# Patient Record
Sex: Female | Born: 1970 | Race: White | Hispanic: No | Smoking: Former smoker
Health system: Southern US, Community
[De-identification: ages and names within clinical notes are randomized; demographics above are authoritative.]

## PROBLEM LIST (undated history)

## (undated) DIAGNOSIS — M8580 Other specified disorders of bone density and structure, unspecified site: Secondary | ICD-10-CM

## (undated) HISTORY — PX: COLPOSCOPY: SHX161

## (undated) HISTORY — PX: ABDOMINAL HYSTERECTOMY: SHX81

---

## 2013-05-21 ENCOUNTER — Other Ambulatory Visit: Payer: Self-pay | Admitting: Obstetrics and Gynecology

## 2013-05-21 DIAGNOSIS — Z1231 Encounter for screening mammogram for malignant neoplasm of breast: Secondary | ICD-10-CM

## 2013-06-15 ENCOUNTER — Ambulatory Visit (HOSPITAL_COMMUNITY): Payer: Self-pay

## 2013-06-15 ENCOUNTER — Ambulatory Visit (HOSPITAL_COMMUNITY): Payer: Self-pay | Attending: Obstetrics and Gynecology

## 2013-07-13 ENCOUNTER — Ambulatory Visit (HOSPITAL_COMMUNITY)
Admission: RE | Admit: 2013-07-13 | Discharge: 2013-07-13 | Disposition: A | Discharge status: Home / Self Care | Payer: Self-pay | Source: Ambulatory Visit | Attending: Obstetrics and Gynecology | Admitting: Obstetrics and Gynecology

## 2013-07-13 ENCOUNTER — Encounter (HOSPITAL_COMMUNITY): Payer: Self-pay

## 2013-07-13 VITALS — BP 110/76 | Temp 98.4°F | Ht 64.0 in | Wt 150.0 lb

## 2013-07-13 DIAGNOSIS — Z1231 Encounter for screening mammogram for malignant neoplasm of breast: Secondary | ICD-10-CM

## 2013-07-13 DIAGNOSIS — Z1239 Encounter for other screening for malignant neoplasm of breast: Secondary | ICD-10-CM

## 2013-07-13 HISTORY — DX: Other specified disorders of bone density and structure, unspecified site: M85.80

## 2013-07-13 NOTE — Patient Instructions (Signed)
Taught Lynn Malone how to perform BSE. Patient did not need a Pap smear today due to a history of a hysterectomy for benign reasons. Let her know that she does not need any further Pap smears due to her history of a hysterectomy for benign reasons. Let patient know will follow up with her within the next couple weeks with results by letter or phone. Lynn Malone verbalized understanding. Patient escorted to mammography for a screening mammogram.  Mars Scheaffer, Kathaleen Maser, RN 3:07 PM

## 2013-07-13 NOTE — Progress Notes (Signed)
No complaints today.  Pap Smear:    Pap smear not completed today. Last Pap smear was in 2013 at [facility] and normal per patient. Per patient has a history of 2-3 abnormal Pap smears 15+ years ago that required colposcopies for follow up. Patient has a history of a complete hysterectomy 14 years ago due to ovarian cysts and ? dysplasia. No Pap smear results in EPIC.  Physical exam: Breasts Breasts symmetrical. No skin abnormalities bilateral breasts. No nipple retraction bilateral breasts. No nipple discharge bilateral breasts. No lymphadenopathy. No lumps palpated bilateral breasts. No complaints of pain or tenderness on exam. Patient escorted to mammography for a screening mammogram.        Pelvic/Bimanual No Pap smear completed today due to a history of a hysterectomy for benign reasons. Pap smear not indicated per BCCCP guidelines.

## 2013-07-19 ENCOUNTER — Telehealth (HOSPITAL_COMMUNITY): Payer: Self-pay | Admitting: *Deleted

## 2013-07-19 NOTE — Telephone Encounter (Signed)
Patient called wanting to know results of mammogram. Advised was negative and next mammogram due in one year. Patient voiced understanding.

## 2014-05-30 ENCOUNTER — Encounter (HOSPITAL_COMMUNITY): Payer: Self-pay

## 2016-05-06 ENCOUNTER — Other Ambulatory Visit (HOSPITAL_COMMUNITY): Payer: Self-pay | Admitting: *Deleted

## 2016-05-06 DIAGNOSIS — N6310 Unspecified lump in the right breast, unspecified quadrant: Secondary | ICD-10-CM

## 2016-05-16 ENCOUNTER — Ambulatory Visit
Admission: RE | Admit: 2016-05-16 | Discharge: 2016-05-16 | Disposition: A | Discharge status: Home / Self Care | Payer: No Typology Code available for payment source | Source: Ambulatory Visit | Attending: Obstetrics and Gynecology | Admitting: Obstetrics and Gynecology

## 2016-05-16 ENCOUNTER — Ambulatory Visit
Admission: RE | Admit: 2016-05-16 | Discharge: 2016-05-16 | Disposition: A | Discharge status: Home / Self Care | Payer: Self-pay | Source: Ambulatory Visit | Attending: Obstetrics and Gynecology | Admitting: Obstetrics and Gynecology

## 2016-05-16 ENCOUNTER — Ambulatory Visit (HOSPITAL_COMMUNITY)
Admission: RE | Admit: 2016-05-16 | Discharge: 2016-05-16 | Disposition: A | Discharge status: Home / Self Care | Payer: Self-pay | Source: Ambulatory Visit | Attending: Obstetrics and Gynecology | Admitting: Obstetrics and Gynecology

## 2016-05-16 ENCOUNTER — Encounter (HOSPITAL_COMMUNITY): Payer: Self-pay

## 2016-05-16 VITALS — BP 106/72 | Temp 98.1°F | Ht 64.0 in | Wt 159.0 lb

## 2016-05-16 DIAGNOSIS — N6315 Unspecified lump in the right breast, overlapping quadrants: Secondary | ICD-10-CM

## 2016-05-16 DIAGNOSIS — N6310 Unspecified lump in the right breast, unspecified quadrant: Secondary | ICD-10-CM

## 2016-05-16 DIAGNOSIS — Z1239 Encounter for other screening for malignant neoplasm of breast: Secondary | ICD-10-CM

## 2016-05-16 NOTE — Patient Instructions (Addendum)
Explained breast self awareness to Lynn BurnAshley Fels. Patient did not need a Pap smear today due to her history of a hysterectomy for benign reasons. Let patient know that she doesn't need any further Pap smears due to her history of a hysterectomy for benign reasons. [facility] and possible right breast ultrasound. Appointment scheduled for Thursday, May 16, 2016 at 1410. Lynn Burnshley Dain verbalized understanding.  [name], Kathaleen Maserhristine Poll, RN 2:25 PM

## 2016-05-16 NOTE — Progress Notes (Signed)
Complaints of right breast and axillary lump x 5 weeks.  Pap Smear:  Pap smear not completed today. Last Pap smear was in 2013 at [facility] and normal per patient. Per patient has a history of 2-3 abnormal Pap smears 15+ years ago that required colposcopies for follow up. Patient has a history of a complete hysterectomy 14 years ago due to ovarian cysts and ? dysplasia. No further Pap smears indicated per BCCCP and ACOG guidelines due to her history of a hysterectomy for benign reasons. No Pap smear results in EPIC.  Physical exam: Breasts Breasts symmetrical. No skin abnormalities bilateral breasts. No nipple retraction bilateral breasts. No nipple discharge bilateral breasts. No lymphadenopathy left axilla. Right axillary lymphadenopathy. No lumps palpated left breast. Palpated a pea sized lump within the right breast at 3 o'clock next to areola. No complaints of pain or tenderness on exam. [facility] for diagnostic mammogram and possible right breast ultrasound. Appointment scheduled for Thursday, May 16, 2016 at 1410.        Pelvic/Bimanual No Pap smear completed today since patient has a history of a hysterectomy for benign reasons. Pap smear not indicated per BCCCP guidelines.   Smoking History: Patient has never smoked.  Patient Navigation: Patient education provided. Access to services provided for patient [facility].

## 2016-05-17 ENCOUNTER — Encounter (HOSPITAL_COMMUNITY): Payer: Self-pay | Admitting: *Deleted

## 2017-05-18 ENCOUNTER — Emergency Department (HOSPITAL_BASED_OUTPATIENT_CLINIC_OR_DEPARTMENT_OTHER)
Admission: EM | Admit: 2017-05-18 | Discharge: 2017-05-18 | Disposition: A | Discharge status: Home / Self Care | Payer: No Typology Code available for payment source | Attending: Emergency Medicine | Admitting: Emergency Medicine

## 2017-05-18 ENCOUNTER — Encounter (HOSPITAL_BASED_OUTPATIENT_CLINIC_OR_DEPARTMENT_OTHER): Payer: Self-pay | Admitting: Emergency Medicine

## 2017-05-18 DIAGNOSIS — Y999 Unspecified external cause status: Secondary | ICD-10-CM | POA: Insufficient documentation

## 2017-05-18 DIAGNOSIS — Z87891 Personal history of nicotine dependence: Secondary | ICD-10-CM | POA: Insufficient documentation

## 2017-05-18 DIAGNOSIS — Y929 Unspecified place or not applicable: Secondary | ICD-10-CM | POA: Insufficient documentation

## 2017-05-18 DIAGNOSIS — W57XXXA Bitten or stung by nonvenomous insect and other nonvenomous arthropods, initial encounter: Secondary | ICD-10-CM | POA: Insufficient documentation

## 2017-05-18 DIAGNOSIS — Y939 Activity, unspecified: Secondary | ICD-10-CM | POA: Insufficient documentation

## 2017-05-18 DIAGNOSIS — S70361A Insect bite (nonvenomous), right thigh, initial encounter: Secondary | ICD-10-CM | POA: Insufficient documentation

## 2017-05-18 LAB — URINALYSIS, ROUTINE W REFLEX MICROSCOPIC
BILIRUBIN URINE: NEGATIVE
GLUCOSE, UA: NEGATIVE mg/dL
HGB URINE DIPSTICK: NEGATIVE
Ketones, ur: NEGATIVE mg/dL
Leukocytes, UA: NEGATIVE
Nitrite: NEGATIVE
PH: 6.5 (ref 5.0–8.0)
Protein, ur: NEGATIVE mg/dL
SPECIFIC GRAVITY, URINE: 1.01 (ref 1.005–1.030)
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
## 2017-05-18 NOTE — Discharge Instructions (Signed)
Your presentation appears consistent with a localized inflammatory reaction to some type of insect bite. Continue to monitor the area and take Benadryl as needed for itching. If it was a benign bite such as from a mosquito expect this bruising to heal with time. If bite is from a brown recluse, would expect an area of black to develop in the center of the area with subsequent necrosis. However, there would not be any type of medical intervention at that point. If you develop fevers, a new rash, vomiting, headaches, etc you need to be seen again.
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
## 2017-05-18 NOTE — ED Notes (Signed)
ED Provider at bedside. 

## 2017-07-01 ENCOUNTER — Other Ambulatory Visit: Payer: Self-pay | Admitting: Family Medicine

## 2017-07-14 ENCOUNTER — Encounter (HOSPITAL_COMMUNITY): Payer: Self-pay

## 2020-10-18 ENCOUNTER — Other Ambulatory Visit (HOSPITAL_BASED_OUTPATIENT_CLINIC_OR_DEPARTMENT_OTHER): Payer: Self-pay

## 2020-10-18 ENCOUNTER — Other Ambulatory Visit (HOSPITAL_BASED_OUTPATIENT_CLINIC_OR_DEPARTMENT_OTHER): Payer: Self-pay | Admitting: Internal Medicine

## 2020-10-18 DIAGNOSIS — Z1231 Encounter for screening mammogram for malignant neoplasm of breast: Secondary | ICD-10-CM

## 2020-10-18 DIAGNOSIS — M8580 Other specified disorders of bone density and structure, unspecified site: Secondary | ICD-10-CM

## 2020-10-27 ENCOUNTER — Other Ambulatory Visit (HOSPITAL_BASED_OUTPATIENT_CLINIC_OR_DEPARTMENT_OTHER): Payer: Self-pay | Admitting: Family Medicine

## 2020-10-27 ENCOUNTER — Other Ambulatory Visit (HOSPITAL_BASED_OUTPATIENT_CLINIC_OR_DEPARTMENT_OTHER): Payer: Self-pay

## 2020-10-27 ENCOUNTER — Ambulatory Visit (HOSPITAL_BASED_OUTPATIENT_CLINIC_OR_DEPARTMENT_OTHER)
Admission: RE | Admit: 2020-10-27 | Discharge: 2020-10-27 | Disposition: A | Discharge status: Home / Self Care | Payer: BC Managed Care – PPO | Source: Ambulatory Visit | Attending: Family Medicine | Admitting: Family Medicine

## 2020-10-27 ENCOUNTER — Other Ambulatory Visit: Payer: Self-pay

## 2020-10-27 DIAGNOSIS — R1909 Other intra-abdominal and pelvic swelling, mass and lump: Secondary | ICD-10-CM

## 2020-10-27 DIAGNOSIS — Z1231 Encounter for screening mammogram for malignant neoplasm of breast: Secondary | ICD-10-CM

## 2020-10-27 DIAGNOSIS — M8580 Other specified disorders of bone density and structure, unspecified site: Secondary | ICD-10-CM

## 2020-12-05 ENCOUNTER — Telehealth: Payer: Self-pay | Admitting: *Deleted

## 2020-12-05 NOTE — Telephone Encounter (Signed)
NOTES ON FILE FROM [facility], BRITTANY WALL, FNP-C, [phone]  SENT REFERRAL TO SCHEDULING.

## 2021-01-11 ENCOUNTER — Other Ambulatory Visit (HOSPITAL_BASED_OUTPATIENT_CLINIC_OR_DEPARTMENT_OTHER): Payer: No Typology Code available for payment source

## 2021-01-11 ENCOUNTER — Ambulatory Visit (HOSPITAL_BASED_OUTPATIENT_CLINIC_OR_DEPARTMENT_OTHER): Payer: BC Managed Care – PPO

## 2021-01-11 ENCOUNTER — Ambulatory Visit (HOSPITAL_BASED_OUTPATIENT_CLINIC_OR_DEPARTMENT_OTHER): Payer: No Typology Code available for payment source | Admitting: Radiology

## 2021-01-22 ENCOUNTER — Ambulatory Visit: Payer: BC Managed Care – PPO | Admitting: Cardiology

## 2021-01-25 ENCOUNTER — Other Ambulatory Visit: Payer: Self-pay

## 2021-01-25 ENCOUNTER — Ambulatory Visit (HOSPITAL_BASED_OUTPATIENT_CLINIC_OR_DEPARTMENT_OTHER)
Admission: RE | Admit: 2021-01-25 | Discharge: 2021-01-25 | Disposition: A | Discharge status: Home / Self Care | Payer: BC Managed Care – PPO | Source: Ambulatory Visit | Attending: Family Medicine | Admitting: Family Medicine

## 2021-01-25 ENCOUNTER — Encounter (HOSPITAL_BASED_OUTPATIENT_CLINIC_OR_DEPARTMENT_OTHER): Payer: Self-pay | Admitting: Radiology

## 2021-01-25 DIAGNOSIS — M8580 Other specified disorders of bone density and structure, unspecified site: Secondary | ICD-10-CM | POA: Diagnosis not present

## 2021-01-25 DIAGNOSIS — Z1231 Encounter for screening mammogram for malignant neoplasm of breast: Secondary | ICD-10-CM | POA: Diagnosis present

## 2021-04-18 ENCOUNTER — Other Ambulatory Visit: Payer: Self-pay | Admitting: Specialist

## 2021-04-18 ENCOUNTER — Other Ambulatory Visit (HOSPITAL_BASED_OUTPATIENT_CLINIC_OR_DEPARTMENT_OTHER): Payer: Self-pay | Admitting: Specialist

## 2021-04-18 DIAGNOSIS — R0600 Dyspnea, unspecified: Secondary | ICD-10-CM

## 2021-04-18 DIAGNOSIS — R0609 Other forms of dyspnea: Secondary | ICD-10-CM

## 2021-06-04 ENCOUNTER — Ambulatory Visit
Admission: RE | Admit: 2021-06-04 | Discharge: 2021-06-04 | Disposition: A | Discharge status: Home / Self Care | Payer: BC Managed Care – PPO | Source: Ambulatory Visit | Attending: Specialist | Admitting: Specialist

## 2021-06-04 ENCOUNTER — Other Ambulatory Visit: Payer: Self-pay

## 2021-06-04 DIAGNOSIS — R0609 Other forms of dyspnea: Secondary | ICD-10-CM | POA: Insufficient documentation

## 2021-10-06 IMAGING — MG MM DIGITAL SCREENING BILAT W/ TOMO AND CAD
8 series · 9 of 24 positions shown · non-contrast
Comparison: Previous exam(s).

CLINICAL DATA: Screening.

EXAM:
DIGITAL SCREENING BILATERAL MAMMOGRAM WITH TOMOSYNTHESIS AND CAD
TECHNIQUE: Bilateral screening digital craniocaudal and mediolateral oblique
mammograms were obtained. Bilateral screening digital breast
tomosynthesis was performed. The images were evaluated with
computer-aided detection.

[L MLO synth-2D]
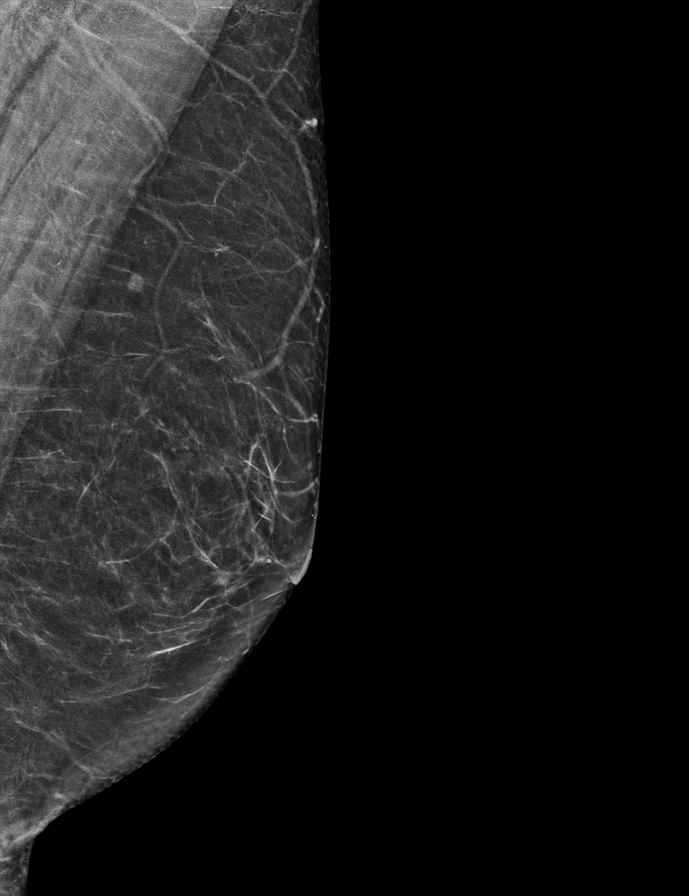

[R MLO synth-2D]
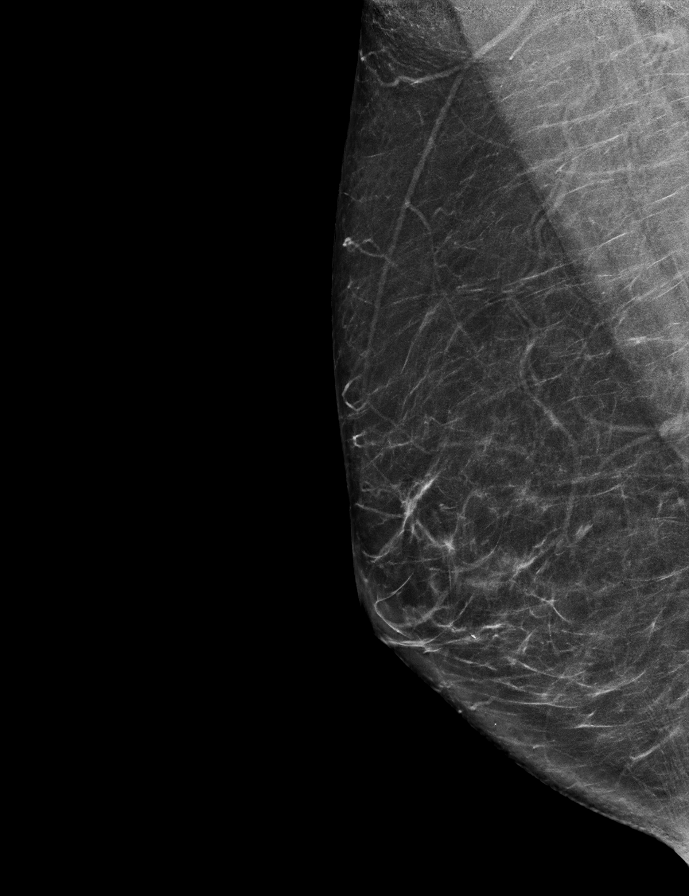

[L CC synth-2D]
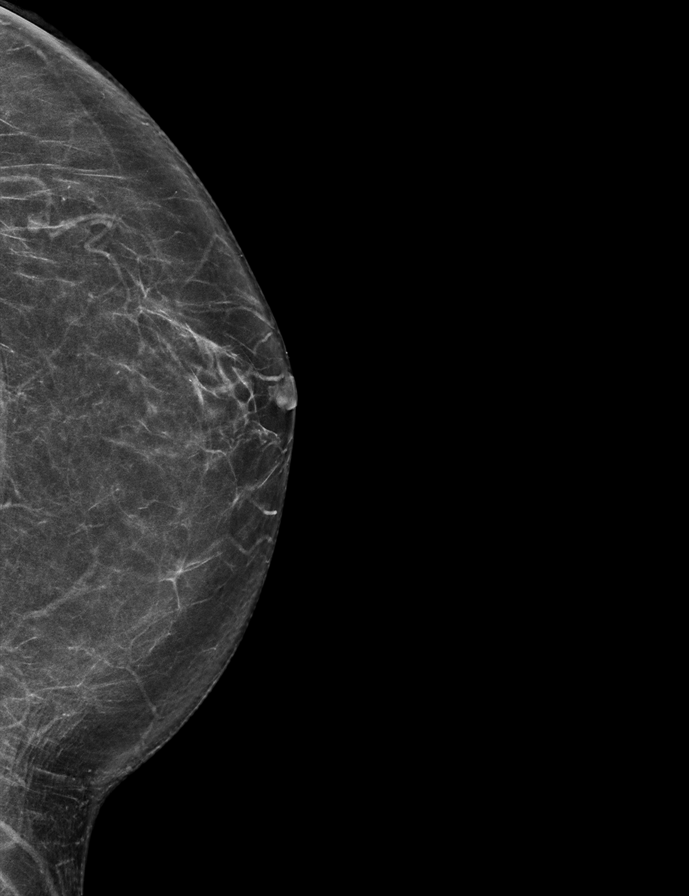

[R CC synth-2D]
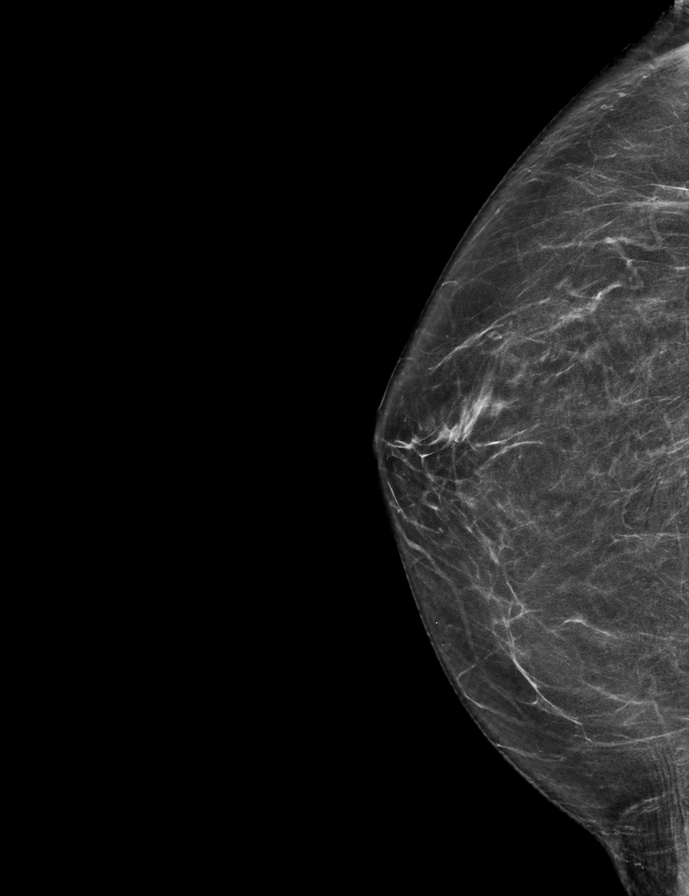

[L CC tomo · 2 of 66 frames shown]
[frame 22/66]
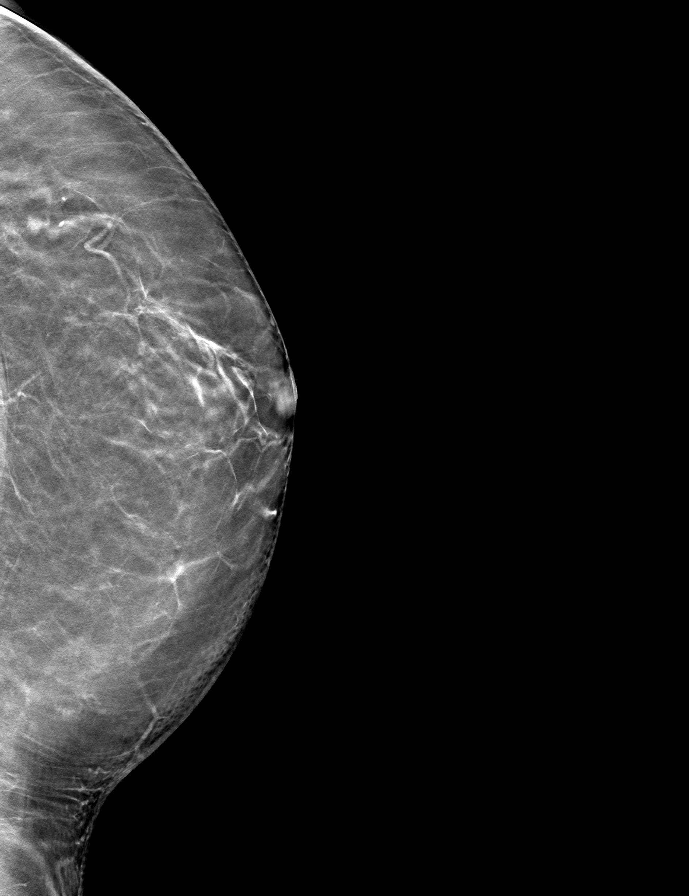
[frame 33/66]
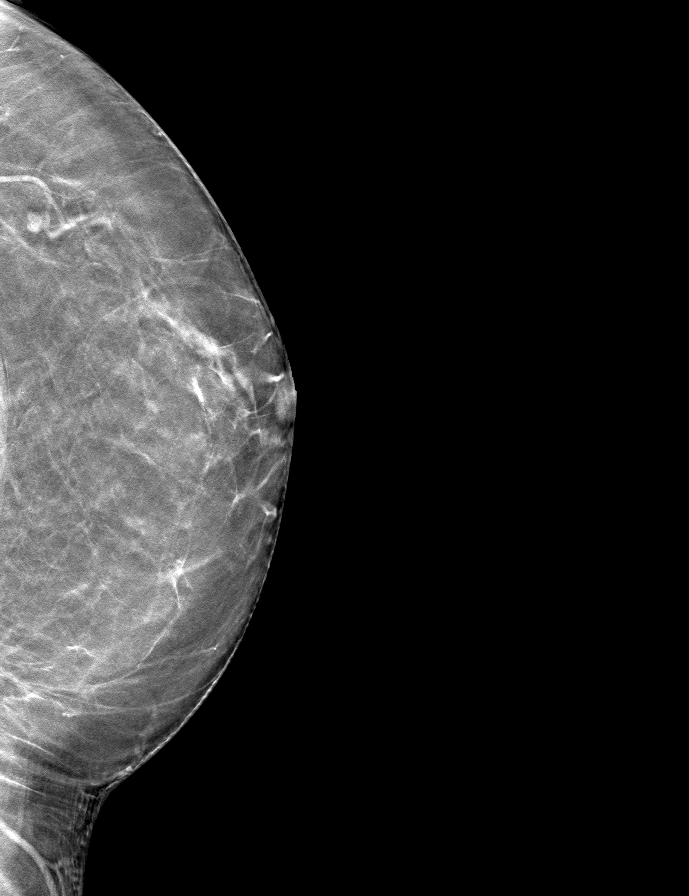

[L MLO tomo · tomo slice 31/61.0]
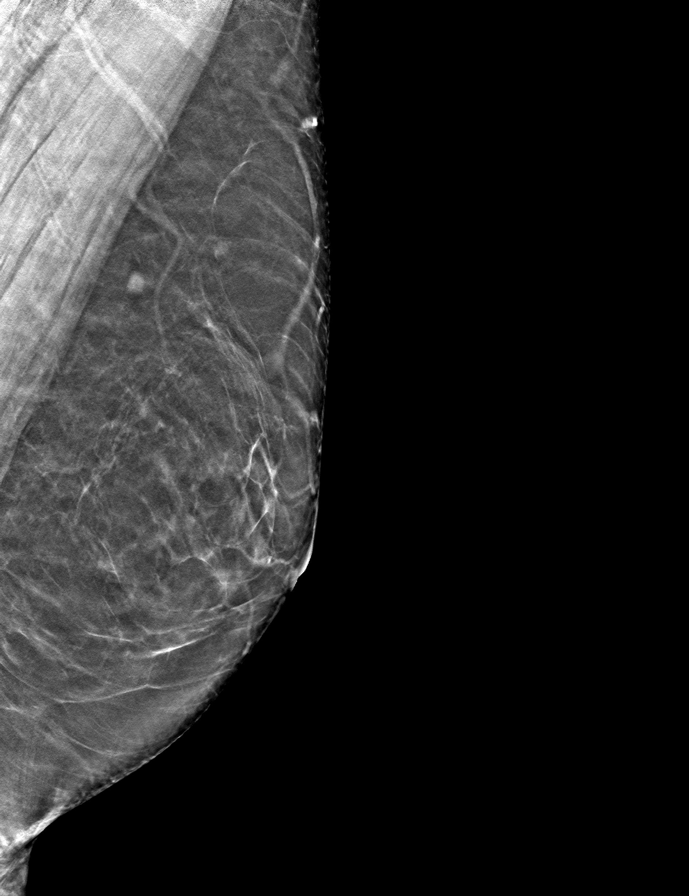

[R CC tomo · tomo slice 33/66.0]
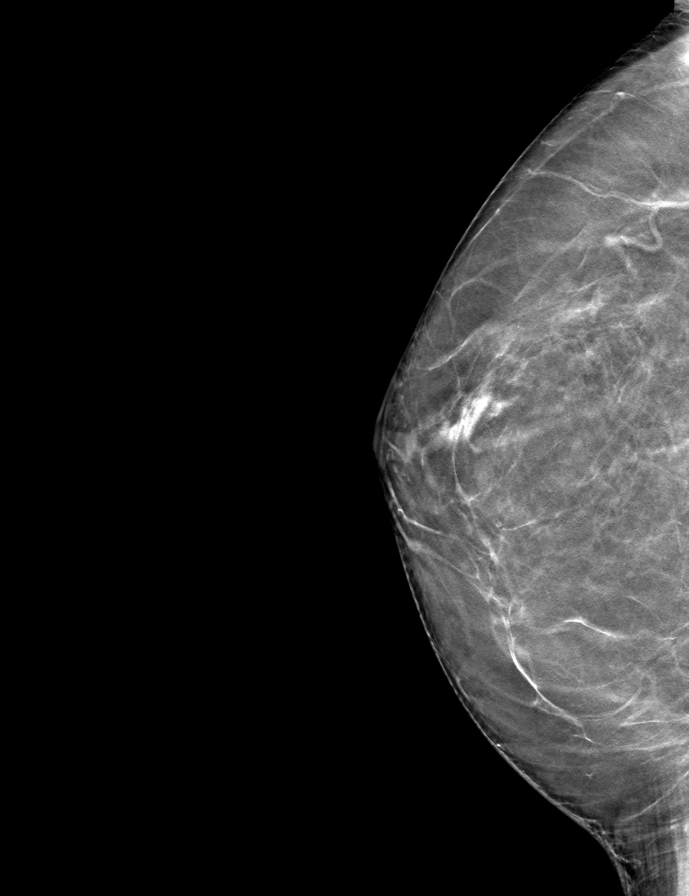

[R MLO tomo · tomo slice 35/68.0]
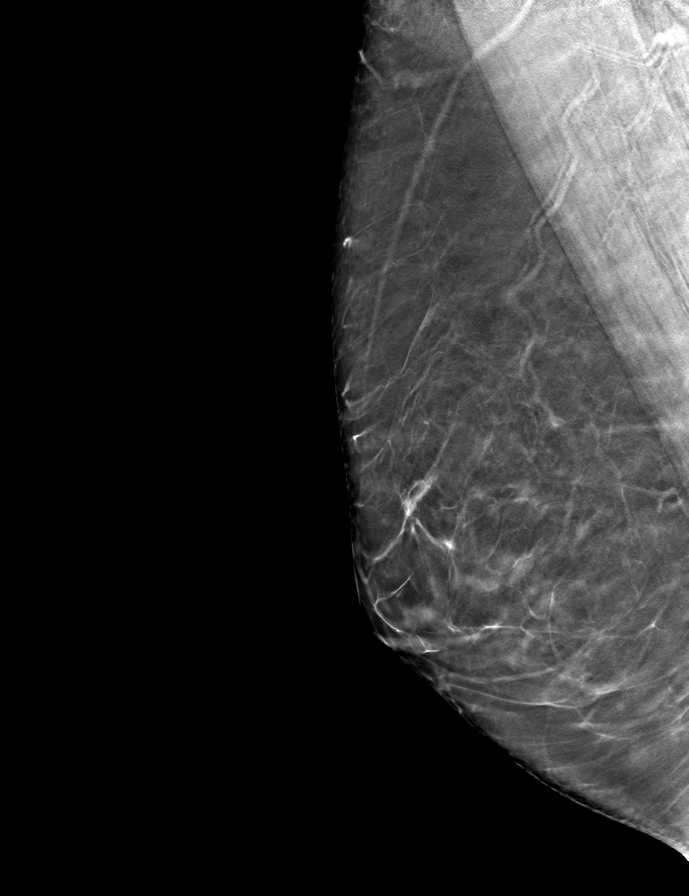

[9 of 24 positions shown; findings below may reference images not displayed]

ACR Breast Density Category b: There are scattered areas of
fibroglandular density.
FINDINGS: There are no findings suspicious for malignancy.
IMPRESSION: No mammographic evidence of malignancy. A result letter of this
screening mammogram will be mailed directly to the patient.

RECOMMENDATION:
Screening mammogram in one year. (Code:51-O-LD2)

BI-RADS CATEGORY  1: Negative.

## 2022-02-13 IMAGING — CT CT CHEST W/O CM
2 of 4 series · 15 of 36 positions shown, 18 images · non-contrast
Comparison: No priors.

CLINICAL DATA: 50-year-old female with history of shortness of
breath. Prior abnormal CT scan demonstrating potential post COVID
scarring in the lungs. Follow-up study.

EXAM:
CT CHEST WITHOUT CONTRAST
TECHNIQUE: Multidetector CT imaging of the chest was performed following the
standard protocol without IV contrast.

[Series 2: chest 2.00 · axial · 0.63mm/px · z∈[-1156,-908]mm · 12 of 148 slices shown, 15 images]
[im 12/148  mediastinal]
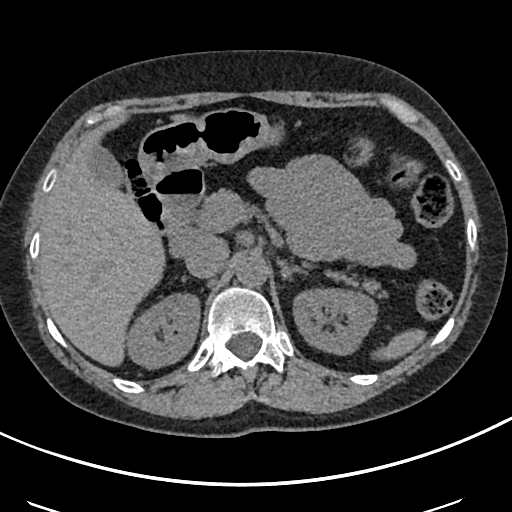
[im 12/148  lung]
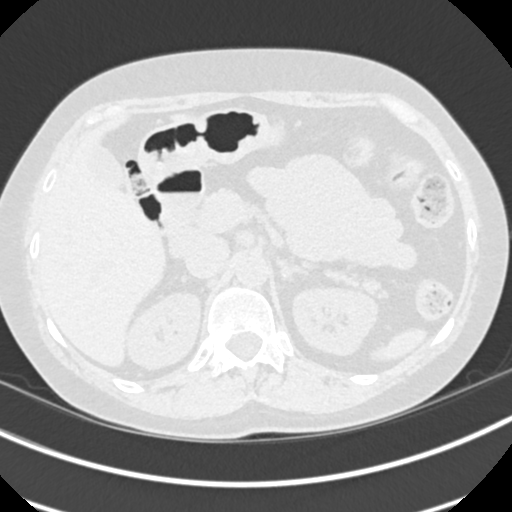
[im 23/148  lung]
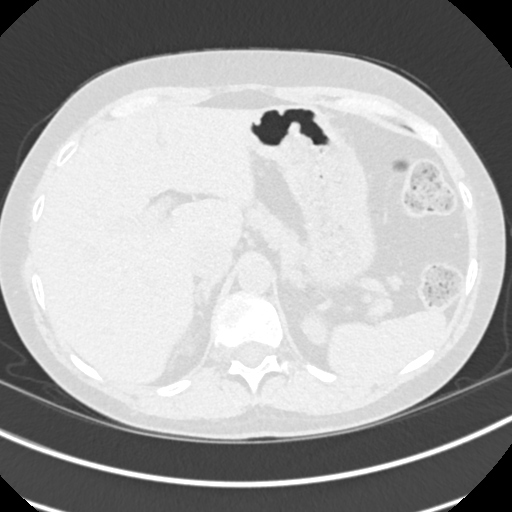
[im 34/148  lung]
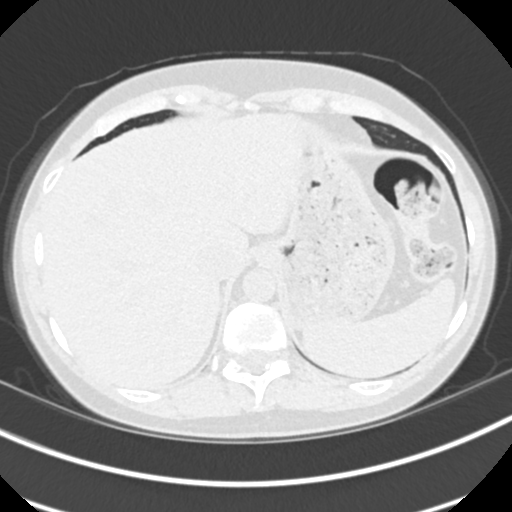
[im 46/148  lung]
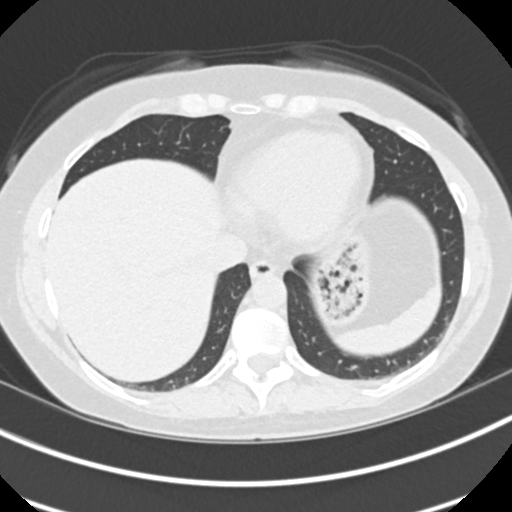
[im 57/148  mediastinal]
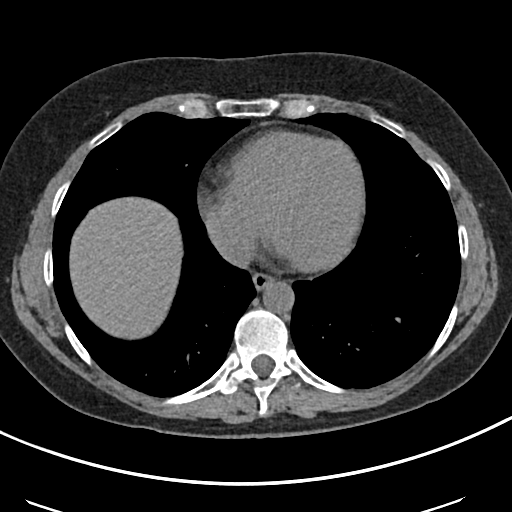
[im 57/148  lung]
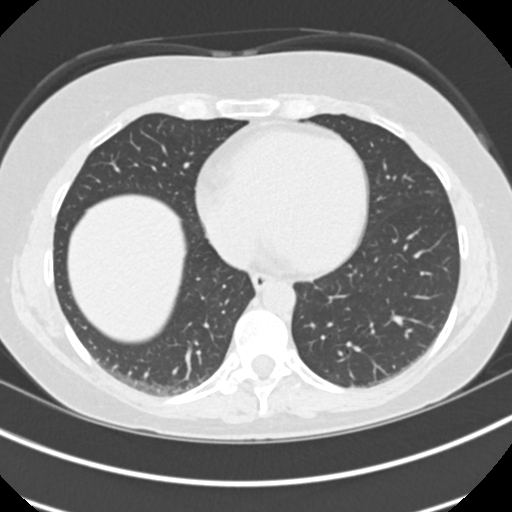
[im 68/148  lung]
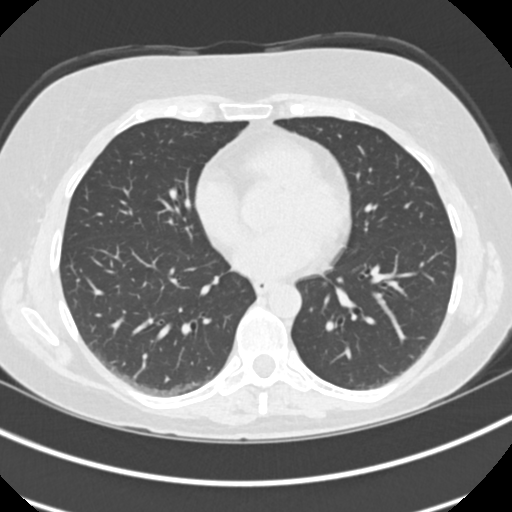
[im 80/148  lung]
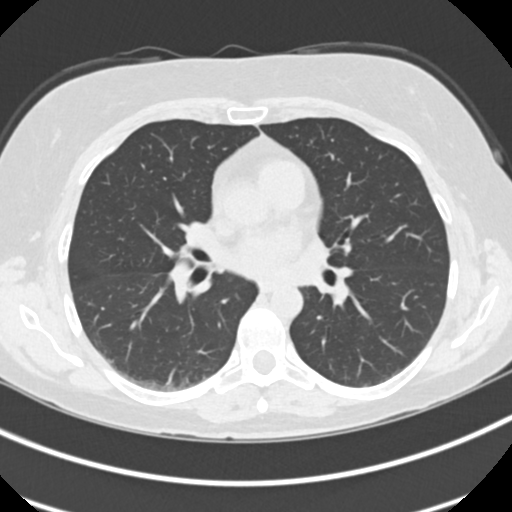
[im 91/148  lung]
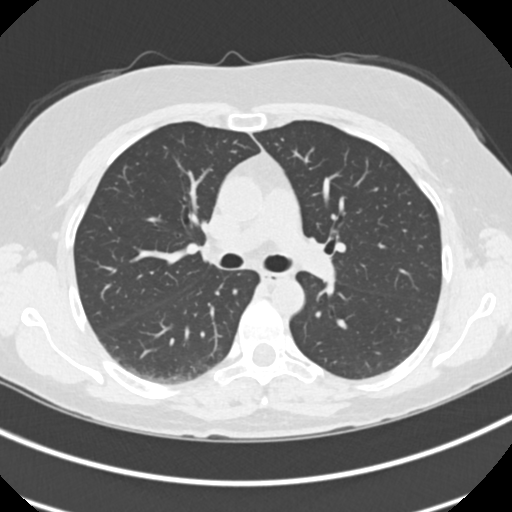
[im 102/148  mediastinal]
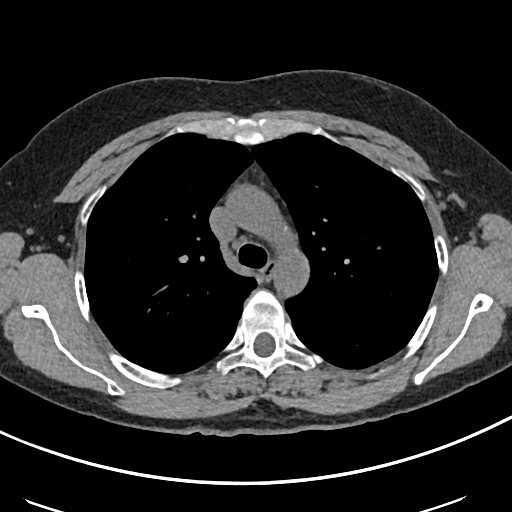
[im 102/148  lung]
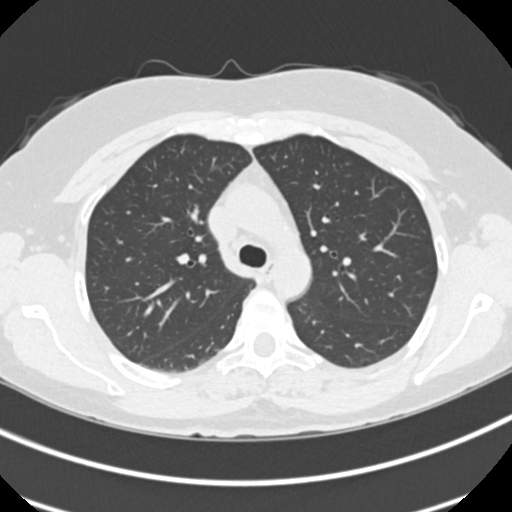
[im 114/148  lung]
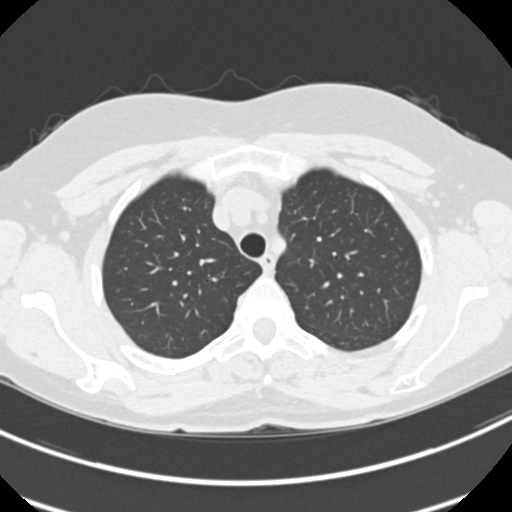
[im 125/148  lung]
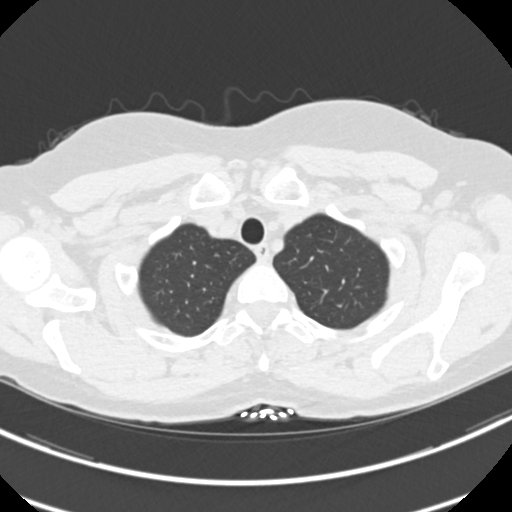
[im 136/148  lung]
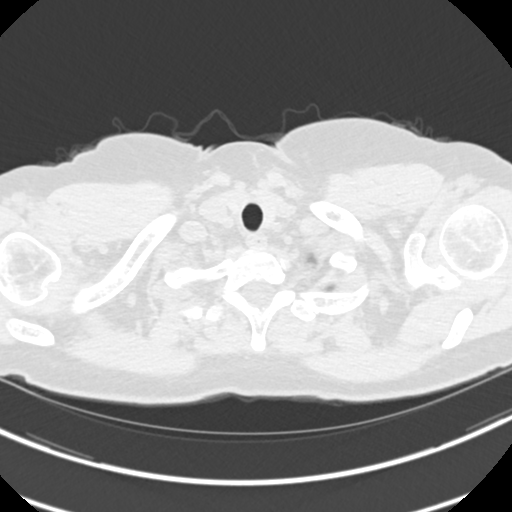

[Series 5: coronals chest 2.00 cor · coronal · 0.58mm/px · 3 of 122 slices shown]
[im 25/122  lung]
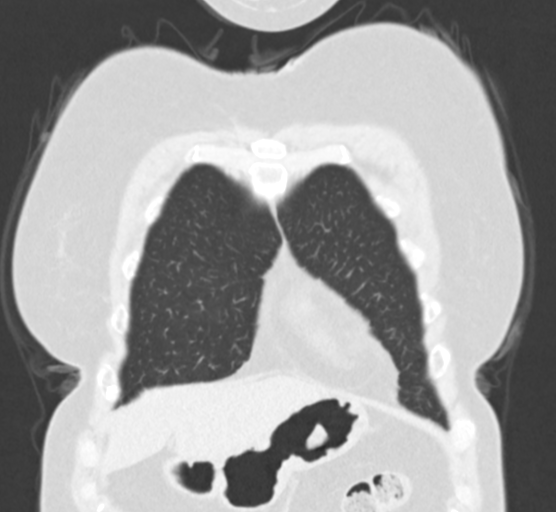
[im 49/122  lung]
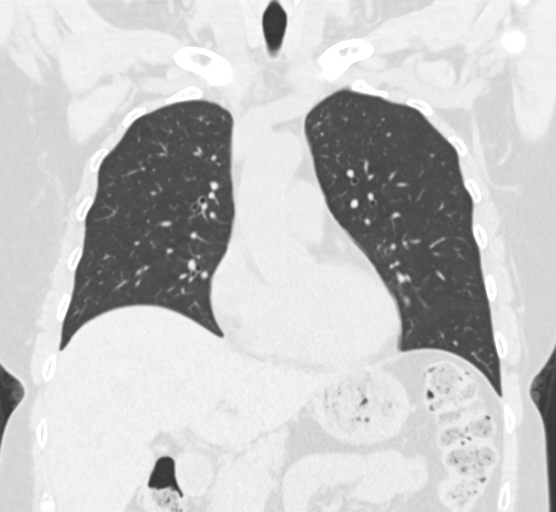
[im 73/122  lung]
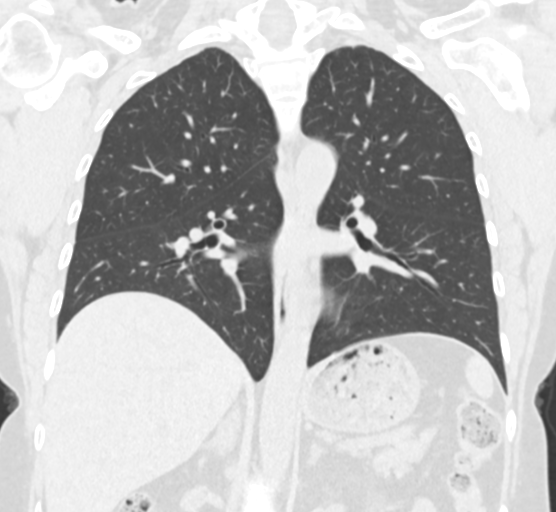

[15 of 36 positions shown; findings below may reference images not displayed]

FINDINGS: Cardiovascular: Heart size is normal. There is no significant
pericardial fluid, thickening or pericardial calcification. No
atherosclerotic calcifications are noted in the thoracic aorta or
the coronary arteries.

Mediastinum/Nodes: No pathologically enlarged mediastinal or hilar
lymph nodes. Please note that accurate exclusion of hilar adenopathy
is limited on noncontrast CT scans. Esophagus is unremarkable in
appearance. No axillary lymphadenopathy.

Lungs/Pleura: High-resolution imaging was not performed. With these
limitations in mind, there are node significant regions of
ground-glass attenuation, septal thickening, subpleural
reticulation, parenchymal banding, traction bronchiectasis or
honeycombing to indicate interstitial lung disease. No large
regional areas of scarring are noted. No acute consolidative
airspace disease. No pleural effusions. No suspicious appearing
pulmonary nodules or masses are noted. Minimal dependent
subsegmental atelectasis is noted in the lower lobes of the lungs
bilaterally.

Upper Abdomen: Unremarkable.

Musculoskeletal: There are no aggressive appearing lytic or blastic
lesions noted in the visualized portions of the skeleton.
IMPRESSION: 1. No findings to suggest interstitial lung disease or substantial
post infectious scarring in the lungs. No acute findings.
